# Patient Record
Sex: Female | Born: 1995
Health system: Southern US, Community
[De-identification: ages and names within clinical notes are randomized; demographics above are authoritative.]

---
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
## 2013-01-25 ENCOUNTER — Emergency Department (HOSPITAL_COMMUNITY)
Admission: EM | Admit: 2013-01-25 | Discharge: 2013-01-25 | Disposition: A | Discharge status: Home / Self Care | Payer: No Typology Code available for payment source | Attending: Emergency Medicine | Admitting: Emergency Medicine

## 2013-01-25 ENCOUNTER — Encounter (HOSPITAL_COMMUNITY): Payer: Self-pay | Admitting: Emergency Medicine

## 2013-01-25 DIAGNOSIS — S0990XA Unspecified injury of head, initial encounter: Secondary | ICD-10-CM | POA: Insufficient documentation

## 2013-01-25 DIAGNOSIS — Z79899 Other long term (current) drug therapy: Secondary | ICD-10-CM | POA: Insufficient documentation

## 2013-01-25 DIAGNOSIS — Z792 Long term (current) use of antibiotics: Secondary | ICD-10-CM | POA: Insufficient documentation

## 2013-01-25 DIAGNOSIS — Y9389 Activity, other specified: Secondary | ICD-10-CM | POA: Insufficient documentation

## 2013-01-25 DIAGNOSIS — Y92410 Unspecified street and highway as the place of occurrence of the external cause: Secondary | ICD-10-CM | POA: Insufficient documentation

## 2013-01-25 DIAGNOSIS — IMO0002 Reserved for concepts with insufficient information to code with codable children: Secondary | ICD-10-CM | POA: Insufficient documentation

## 2013-01-25 MED ORDER — METHOCARBAMOL 500 MG PO TABS: 500.0000 mg | ORAL_TABLET | Freq: Two times a day (BID) | ORAL | Status: DC

## 2013-01-25 MED ORDER — IBUPROFEN 800 MG PO TABS: 800.0000 mg | ORAL_TABLET | Freq: Three times a day (TID) | ORAL | Status: AC
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
## 2013-01-25 NOTE — ED Provider Notes (Signed)
Medical screening examination/treatment/procedure(s) were performed by non-physician practitioner and as supervising physician I was immediately available for consultation/collaboration.  EKG Interpretation   None        Jaidence Geisler, MD 01/25/13 1929 
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
## 2020-08-25 ENCOUNTER — Ambulatory Visit (HOSPITAL_COMMUNITY): Admission: EM | Admit: 2020-08-25 | Discharge status: Against Medical Advice | Payer: Self-pay

## 2020-08-25 ENCOUNTER — Encounter (HOSPITAL_COMMUNITY): Payer: Self-pay

## 2020-08-25 ENCOUNTER — Ambulatory Visit (HOSPITAL_COMMUNITY)
Admission: EM | Admit: 2020-08-25 | Discharge: 2020-08-25 | Disposition: A | Discharge status: Home / Self Care | Payer: 59

## 2020-08-25 ENCOUNTER — Other Ambulatory Visit: Payer: Self-pay

## 2020-08-25 DIAGNOSIS — H6122 Impacted cerumen, left ear: Secondary | ICD-10-CM | POA: Diagnosis not present

## 2020-08-25 DIAGNOSIS — H66002 Acute suppurative otitis media without spontaneous rupture of ear drum, left ear: Secondary | ICD-10-CM

## 2020-08-25 MED ORDER — AMOXICILLIN-POT CLAVULANATE 875-125 MG PO TABS: 1.0000 | ORAL_TABLET | Freq: Two times a day (BID) | ORAL | 0 refills | Status: DC

## 2020-08-25 NOTE — Discharge Instructions (Addendum)
-  Start the antibiotic-Augmentin (amoxicillin-clavulanate), 1 pill every 12 hours for 7 days.  You can take this with food like with breakfast and dinner. -Try Debrox over the counter for maintenance of your earwax

## 2020-08-25 NOTE — ED Triage Notes (Signed)
T presents with left ear pain since last night. Denies fever, chills, drainage.

## 2020-08-25 NOTE — ED Provider Notes (Signed)
[facility]    CSN: [id number] Arrival date & time: 08/25/20  1118      History   Chief Complaint Chief Complaint  Patient presents with   Otalgia    HPI Stacie Parsons is a 25 y.o. female presenting with left ear pain for 1 day.  Self-reported medical history of cerumen impaction in the left ear in the past.  Describes 1 day of sharp and throbbing pain in her left ear.  Denies tinnitus, dizziness, hearing changes, muffled hearing.  Denies recent URI, fever/chills.  HPI  History reviewed. No pertinent past medical history.  There are no problems to display for this patient.   History reviewed. No pertinent surgical history.  OB History   No obstetric history on file.      Home Medications    Prior to Admission medications   Medication Sig Start Date End Date Taking? Authorizing Provider  amoxicillin-clavulanate (AUGMENTIN) 875-125 MG tablet Take 1 tablet by mouth every 12 (twelve) hours. 08/25/20  Yes Rhys Martini, PA-C  doxycycline (VIBRA-TABS) 100 MG tablet Take 1 tablet by mouth 2 (two) times daily. 12/10/12   [provider]  ibuprofen (ADVIL,MOTRIN) 800 MG tablet Take 1 tablet (800 mg total) by mouth 3 (three) times daily. 01/25/13   Fayrene Helper, PA-C  KURVELO 0.15-30 MG-MCG tablet Take 1 tablet by mouth daily. 08/11/20   [provider]  methocarbamol (ROBAXIN) 500 MG tablet Take 1 tablet (500 mg total) by mouth 2 (two) times daily. 01/25/13   Fayrene Helper, PA-C  tretinoin (RETIN-A) 0.1 % cream Apply 1 application topically at bedtime. 12/10/12   [provider]    Family History Family History  Problem Relation Age of Onset   Healthy Mother    Healthy Father     Social History Social History   Tobacco Use   Smoking status: Never   Smokeless tobacco: Never  Substance Use Topics   Alcohol use: No   Drug use: No     Allergies   Patient has no known allergies.   Review of Systems Review of Systems   Constitutional:  Negative for appetite change, chills and fever.  HENT:  Positive for ear pain. Negative for congestion, rhinorrhea, sinus pressure, sinus pain and sore throat.   Eyes:  Negative for redness and visual disturbance.  Respiratory:  Negative for cough, chest tightness, shortness of breath and wheezing.   Cardiovascular:  Negative for chest pain and palpitations.  Gastrointestinal:  Negative for abdominal pain, constipation, diarrhea, nausea and vomiting.  Genitourinary:  Negative for dysuria, frequency and urgency.  Musculoskeletal:  Negative for myalgias.  Neurological:  Negative for dizziness, weakness and headaches.  Psychiatric/Behavioral:  Negative for confusion.   All other systems reviewed and are negative.   Physical Exam Triage Vital Signs ED Triage Vitals  Enc Vitals Group     BP 08/25/20 1232 132/87     Pulse Rate 08/25/20 1232 100     Resp 08/25/20 1232 16     Temp 08/25/20 1232 98.3 F (36.8 C)     Temp Source 08/25/20 1232 Oral     SpO2 08/25/20 1232 100 %     Weight --      Height --      Head Circumference --      Peak Flow --      Pain Score 08/25/20 1229 8     Pain Loc --      Pain Edu? --  Excl. in [place]? --    No data found.  Updated Vital Signs BP 132/87 (BP Location: Right Arm)   Pulse 100   Temp 98.3 F (36.8 C) (Oral)   Resp 16   LMP 07/28/2020 (Exact Date)   SpO2 100%   Visual Acuity Right Eye Distance:   Left Eye Distance:   Bilateral Distance:    Right Eye Near:   Left Eye Near:    Bilateral Near:     Physical Exam Vitals reviewed.  Constitutional:      General: She is not in acute distress.    Appearance: Normal appearance. She is not ill-appearing.  HENT:     Head: Normocephalic and atraumatic.     Right Ear: Hearing, tympanic membrane, ear canal and external ear normal. No swelling or tenderness. There is no impacted cerumen. No mastoid tenderness. Tympanic membrane is not perforated, erythematous, retracted or  bulging.     Left Ear: Hearing, ear canal and external ear normal. Tenderness present. No swelling. There is no impacted cerumen. No mastoid tenderness. Tympanic membrane is erythematous. Tympanic membrane is not perforated, retracted or bulging.     Ears:     Comments: L TM initially fully occluded by cerumen L preauricular lymphadenopathy    Nose:     Right Sinus: No maxillary sinus tenderness or frontal sinus tenderness.     Left Sinus: No maxillary sinus tenderness or frontal sinus tenderness.     Mouth/Throat:     Mouth: Mucous membranes are moist.     Pharynx: Uvula midline. No oropharyngeal exudate or posterior oropharyngeal erythema.     Tonsils: No tonsillar exudate.  Cardiovascular:     Rate and Rhythm: Normal rate and regular rhythm.     Heart sounds: Normal heart sounds.  Pulmonary:     Breath sounds: Normal breath sounds and air entry. No wheezing, rhonchi or rales.  Chest:     Chest wall: No tenderness.  Abdominal:     General: Abdomen is flat. Bowel sounds are normal.     Tenderness: There is no abdominal tenderness. There is no guarding or rebound.  Lymphadenopathy:     Cervical: No cervical adenopathy.  Neurological:     General: No focal deficit present.     Mental Status: She is alert and oriented to person, place, and time.  Psychiatric:        Attention and Perception: Attention and perception normal.        Mood and Affect: Mood and affect normal.        Behavior: Behavior normal. Behavior is cooperative.        Thought Content: Thought content normal.        Judgment: Judgment normal.     UC Treatments / Results  Labs (all labs ordered are listed, but only abnormal results are displayed) Labs Reviewed - No data to display  EKG   Radiology No results found.  Procedures Procedures (including critical care time)  Medications Ordered in UC Medications - No data to display  Initial Impression / Assessment and Plan / UC Course  I have reviewed  the triage vital signs and the nursing notes.  Pertinent labs & imaging results that were available during my care of the patient were reviewed by me and considered in my medical decision making (see chart for details).     This patient is a 25 year old female presenting with L cerumen impaction and L AOM. Today this pt is afebrile nontachycardic nontachypneic, oxygenating well  on room air, no wheezes rhonchi or rales. Denies recent URI.  States she is not pregnant or breast-feeding. Augmentin sent as below.   ED return precautions discussed.  Final Clinical Impressions(s) / UC Diagnoses   Final diagnoses:  Impacted cerumen of left ear  Non-recurrent acute suppurative otitis media of left ear without spontaneous rupture of tympanic membrane     Discharge Instructions      -Start the antibiotic-Augmentin (amoxicillin-clavulanate), 1 pill every 12 hours for 7 days.  You can take this with food like with breakfast and dinner. -Try Debrox over the counter for maintenance of your earwax     ED Prescriptions     Medication Sig Dispense Auth. Provider   amoxicillin-clavulanate (AUGMENTIN) 875-125 MG tablet Take 1 tablet by mouth every 12 (twelve) hours. 14 tablet Rhys Martini, PA-C      PDMP not reviewed this encounter.   Rhys Martini, PA-C 08/25/20 1319

## 2021-02-03 ENCOUNTER — Encounter (HOSPITAL_COMMUNITY): Payer: Self-pay | Admitting: Emergency Medicine

## 2021-02-03 ENCOUNTER — Other Ambulatory Visit: Payer: Self-pay

## 2021-02-03 ENCOUNTER — Ambulatory Visit (HOSPITAL_COMMUNITY)
Admission: EM | Admit: 2021-02-03 | Discharge: 2021-02-03 | Disposition: A | Discharge status: Home / Self Care | Payer: BC Managed Care – PPO | Attending: Physician Assistant | Admitting: Physician Assistant

## 2021-02-03 DIAGNOSIS — H02844 Edema of left upper eyelid: Secondary | ICD-10-CM

## 2021-02-03 DIAGNOSIS — H0289 Other specified disorders of eyelid: Secondary | ICD-10-CM

## 2021-02-03 DIAGNOSIS — H02849 Edema of unspecified eye, unspecified eyelid: Secondary | ICD-10-CM

## 2021-02-03 DIAGNOSIS — H02841 Edema of right upper eyelid: Secondary | ICD-10-CM | POA: Diagnosis not present

## 2021-02-03 MED ORDER — METHYLPREDNISOLONE SODIUM SUCC 125 MG IJ SOLR
INTRAMUSCULAR | Status: AC
  Filled 2021-02-03: qty 2

## 2021-02-03 MED ORDER — CIPROFLOXACIN HCL 0.3 % OP SOLN: OPHTHALMIC | 0 refills | Status: AC

## 2021-02-03 MED ORDER — METHYLPREDNISOLONE ACETATE 80 MG/ML IJ SUSP
INTRAMUSCULAR | Status: AC
  Filled 2021-02-03: qty 1

## 2021-02-03 MED ORDER — METHYLPREDNISOLONE ACETATE 80 MG/ML IJ SUSP
60.0000 mg | Freq: Once | INTRAMUSCULAR | Status: AC
  Administered 2021-02-03: 60 mg via INTRAMUSCULAR

## 2021-02-03 NOTE — Discharge Instructions (Addendum)
I am concerned for an allergic reaction.  We gave you a shot of steroids to help with your symptoms.  You can use over-the-counter antihistamines including Zyrtec in the morning and famotidine/Pepcid at night.  Given you wear contacts and had some drainage we are going to cover for a bacterial infection.  Please take ciprofloxacin eyedrops as prescribed.  Do not put contacts in until you have been reevaluated and symptoms have resolved.  If symptoms do not improve quickly please follow-up with ophthalmologist as we discussed.  If you have any sudden worsening symptoms please return for reevaluation.

## 2021-02-03 NOTE — ED Triage Notes (Signed)
Pt presents with red, watery, and swelling in both eyes that started yesterday.

## 2021-02-03 NOTE — ED Provider Notes (Signed)
[facility]    CSN: [id number] Arrival date & time: 02/03/21  3154      History   Chief Complaint Chief Complaint  Patient presents with   Eye Swelling    HPI Stacie Parsons is a 25 y.o. female.   Patient presents today with a 2-day history of swelling and redness of both eyes but worse on left.  Reports symptoms began after she tried new contacts.  She initially had a burning sensation in both eyes presenting to contacts out.  She tried to use Systane but this did not provide any relief.  She has not used any additional contacts since that time.  She did take some allergy medication assuming this could be contributing to symptoms but this provided no relief.  She denies any visual changes but has noticed some drainage particularly overnight.  Denies any recent illness or additional symptoms including fever, nausea, vomiting, headache, cough, congestion.  She is able to perform daily activities despite symptoms.   History reviewed. No pertinent past medical history.  There are no problems to display for this patient.   History reviewed. No pertinent surgical history.  OB History   No obstetric history on file.      Home Medications    Prior to Admission medications   Medication Sig Start Date End Date Taking? Authorizing Provider  ciprofloxacin (CILOXAN) 0.3 % ophthalmic solution Administer 1 drop, every 2 hours, while awake, for 2 days. Then 1 drop, every 4 hours, while awake, for the next 5 days. 02/03/21  Yes Johnavon Mcclafferty K, PA-C  amoxicillin-clavulanate (AUGMENTIN) 875-125 MG tablet Take 1 tablet by mouth every 12 (twelve) hours. 08/25/20   Rhys Martini, PA-C  doxycycline (VIBRA-TABS) 100 MG tablet Take 1 tablet by mouth 2 (two) times daily. 12/10/12   [provider]  ibuprofen (ADVIL,MOTRIN) 800 MG tablet Take 1 tablet (800 mg total) by mouth 3 (three) times daily. 01/25/13   Fayrene Helper, PA-C  KURVELO 0.15-30 MG-MCG tablet Take 1 tablet by  mouth daily. 08/11/20   [provider]  methocarbamol (ROBAXIN) 500 MG tablet Take 1 tablet (500 mg total) by mouth 2 (two) times daily. 01/25/13   Fayrene Helper, PA-C  tretinoin (RETIN-A) 0.1 % cream Apply 1 application topically at bedtime. 12/10/12   [provider]    Family History Family History  Problem Relation Age of Onset   Healthy Mother    Healthy Father     Social History Social History   Tobacco Use   Smoking status: Never   Smokeless tobacco: Never  Substance Use Topics   Alcohol use: No   Drug use: No     Allergies   Patient has no known allergies.   Review of Systems Review of Systems  Constitutional:  Positive for activity change. Negative for appetite change, fatigue and fever.  HENT:  Negative for congestion, sinus pressure, sneezing and sore throat.   Eyes:  Positive for discharge and itching. Negative for photophobia, pain, redness and visual disturbance.  Respiratory:  Negative for cough and shortness of breath.   Cardiovascular:  Negative for chest pain.  Gastrointestinal:  Negative for abdominal pain, diarrhea, nausea and vomiting.  Neurological:  Negative for dizziness, light-headedness and headaches.    Physical Exam Triage Vital Signs ED Triage Vitals  Enc Vitals Group     BP 02/03/21 0909 134/87     Pulse Rate 02/03/21 0909 99     Resp 02/03/21 0909 16  Temp 02/03/21 0909 99.1 F (37.3 C)     Temp Source 02/03/21 0909 Oral     SpO2 02/03/21 0909 97 %     Weight --      Height --      Head Circumference --      Peak Flow --      Pain Score 02/03/21 0907 3     Pain Loc --      Pain Edu? --      Excl. in [place]? --    No data found.  Updated Vital Signs BP 134/87 (BP Location: Right Arm)   Pulse 99   Temp 99.1 F (37.3 C) (Oral)   Resp 16   LMP 01/17/2021   SpO2 97%   Visual Acuity Right Eye Distance: 20/20 (with glasses) Left Eye Distance: 20/25 (with glasses) Bilateral Distance: 20/20 (with  glasses)  Right Eye Near:   Left Eye Near:    Bilateral Near:     Physical Exam Vitals reviewed.  Constitutional:      General: She is awake. She is not in acute distress.    Appearance: Normal appearance. She is well-developed. She is not ill-appearing.     Comments: Very pleasant female appears stated age no acute distress sitting comfortably in exam room  HENT:     Head: Normocephalic and atraumatic.  Eyes:     General:        Right eye: No hordeolum.        Left eye: No hordeolum.     Extraocular Movements: Extraocular movements intact.     Conjunctiva/sclera: Conjunctivae normal.     Pupils: Pupils are equal, round, and reactive to light.     Comments: Swelling noted bilateral upper eyelids.  No significant injection or erythema of conjunctive.  Cardiovascular:     Rate and Rhythm: Normal rate and regular rhythm.     Heart sounds: Normal heart sounds, S1 normal and S2 normal. No murmur heard. Pulmonary:     Effort: Pulmonary effort is normal.     Breath sounds: Normal breath sounds. No wheezing, rhonchi or rales.     Comments: Clear to auscultation bilaterally Abdominal:     Palpations: Abdomen is soft.     Tenderness: There is no abdominal tenderness.  Musculoskeletal:     Cervical back: Normal range of motion and neck supple.  Psychiatric:        Behavior: Behavior is cooperative.     UC Treatments / Results  Labs (all labs ordered are listed, but only abnormal results are displayed) Labs Reviewed - No data to display  EKG   Radiology No results found.  Procedures Procedures (including critical care time)  Medications Ordered in UC Medications  methylPREDNISolone acetate (DEPO-MEDROL) injection 60 mg (60 mg Intramuscular Given 02/03/21 0949)    Initial Impression / Assessment and Plan / UC Course  I have reviewed the triage vital signs and the nursing notes.  Pertinent labs & imaging results that were available during my care of the patient were  reviewed by me and considered in my medical decision making (see chart for details).     Concern for allergic reaction given clinical presentation.  Patient was given Depo-Medrol in clinic with instruction to alternate antihistamines at home.  She did have some increased drainage from the eye and wears contacts and will cover with fluoroquinolone.  She was started on ciprofloxacin ophthalmic solution.  Discussed that she is not to use contacts until symptoms have resolved and  she is feeling much better.  Can continue using Systane/lubricating eyedrops as needed.  Recommended warm compresses for additional symptom relief.  Discussed that if she has any worsening symptoms including increased swelling or vision changes she needs to be seen by ophthalmologist and was given contact information for local provider.  Discussed alarm symptoms that warrant going to the ER.  Strict return precautions given to which she expressed understanding.  Final Clinical Impressions(s) / UC Diagnoses   Final diagnoses:  Pain and swelling of eyelid     Discharge Instructions      I am concerned for an allergic reaction.  We gave you a shot of steroids to help with your symptoms.  You can use over-the-counter antihistamines including Zyrtec in the morning and famotidine/Pepcid at night.  Given you wear contacts and had some drainage we are going to cover for a bacterial infection.  Please take ciprofloxacin eyedrops as prescribed.  Do not put contacts in until you have been reevaluated and symptoms have resolved.  If symptoms do not improve quickly please follow-up with ophthalmologist as we discussed.  If you have any sudden worsening symptoms please return for reevaluation.     ED Prescriptions     Medication Sig Dispense Auth. Provider   ciprofloxacin (CILOXAN) 0.3 % ophthalmic solution Administer 1 drop, every 2 hours, while awake, for 2 days. Then 1 drop, every 4 hours, while awake, for the next 5 days. 5 mL  Hartlyn Reigel K, PA-C      PDMP not reviewed this encounter.   Terrilee Croak, PA-C 02/03/21 1001

## 2021-03-18 ENCOUNTER — Encounter (HOSPITAL_COMMUNITY): Payer: Self-pay

## 2021-03-18 ENCOUNTER — Ambulatory Visit (HOSPITAL_COMMUNITY)
Admission: EM | Admit: 2021-03-18 | Discharge: 2021-03-18 | Disposition: A | Discharge status: Home / Self Care | Payer: BC Managed Care – PPO | Attending: Urgent Care | Admitting: Urgent Care

## 2021-03-18 ENCOUNTER — Other Ambulatory Visit: Payer: Self-pay

## 2021-03-18 DIAGNOSIS — R000 Tachycardia, unspecified: Secondary | ICD-10-CM | POA: Diagnosis not present

## 2021-03-18 DIAGNOSIS — U071 COVID-19: Secondary | ICD-10-CM | POA: Diagnosis not present

## 2021-03-18 MED ORDER — CETIRIZINE HCL 10 MG PO TABS: 10.0000 mg | ORAL_TABLET | Freq: Every day | ORAL | 0 refills | Status: AC

## 2021-03-18 MED ORDER — MOLNUPIRAVIR EUA 200MG CAPSULE: 4.0000 | ORAL_CAPSULE | Freq: Two times a day (BID) | ORAL | 0 refills | Status: AC

## 2021-03-18 MED ORDER — BENZONATATE 100 MG PO CAPS
100.0000 mg | ORAL_CAPSULE | Freq: Three times a day (TID) | ORAL | 0 refills | Status: DC | PRN
Start: 2021-03-18 — End: 2023-04-21

## 2021-03-18 MED ORDER — PROMETHAZINE-DM 6.25-15 MG/5ML PO SYRP: 5.0000 mL | ORAL_SOLUTION | Freq: Every evening | ORAL | 0 refills | Status: DC | PRN

## 2021-03-18 NOTE — ED Provider Notes (Signed)
[facility]   MRN: [id number] DOB: 1995-11-27  Subjective:   Stacie Parsons is a 25 y.o. female presenting for COVID evaluation.  Took a COVID test and was negative on 03/09/2021. Initially had a stuffy nose, throat pain, sneezing. Was improving and feeling better up until 03/16/2021. Started coughing, having back soreness. She is now having throat pain, stuffy nose again. Took a COVID test today and was positive.  Patient is not a smoker, no history of respiratory disorders.  She is in general good health.  Only takes OCPs.  Does not want to take Paxlovid because she does not want blood work. Would like molnupiravir.   No current facility-administered medications for this encounter.  Current Outpatient Medications:    amoxicillin-clavulanate (AUGMENTIN) 875-125 MG tablet, Take 1 tablet by mouth every 12 (twelve) hours., Disp: 14 tablet, Rfl: 0   ciprofloxacin (CILOXAN) 0.3 % ophthalmic solution, Administer 1 drop, every 2 hours, while awake, for 2 days. Then 1 drop, every 4 hours, while awake, for the next 5 days., Disp: 5 mL, Rfl: 0   ibuprofen (ADVIL,MOTRIN) 800 MG tablet, Take 1 tablet (800 mg total) by mouth 3 (three) times daily., Disp: 21 tablet, Rfl: 0   KURVELO 0.15-30 MG-MCG tablet, Take 1 tablet by mouth daily., Disp: , Rfl:    No Known Allergies  History reviewed. No pertinent past medical history.   History reviewed. No pertinent surgical history.  Family History  Problem Relation Age of Onset   Healthy Mother    Healthy Father    Social History   Tobacco Use   Smoking status: Never   Smokeless tobacco: Never  Substance Use Topics   Alcohol use: No   Drug use: No    ROS   Objective:   Vitals: BP (!) 148/104    Pulse (!) 122    Temp 99.7 F (37.6 C) (Oral)    Resp 17    LMP 03/11/2021 (Exact Date)    SpO2 97%   Physical Exam Constitutional:      General: She is not in acute distress.    Appearance: Normal appearance. She is  well-developed. She is not ill-appearing, toxic-appearing or diaphoretic.  HENT:     Head: Normocephalic and atraumatic.     Nose: Nose normal.     Mouth/Throat:     Mouth: Mucous membranes are moist.  Eyes:     Extraocular Movements: Extraocular movements intact.     Pupils: Pupils are equal, round, and reactive to light.  Cardiovascular:     Rate and Rhythm: Normal rate and regular rhythm.     Pulses: Normal pulses.     Heart sounds: Normal heart sounds. No murmur heard.   No friction rub. No gallop.  Pulmonary:     Effort: Pulmonary effort is normal. No respiratory distress.     Breath sounds: Normal breath sounds. No stridor. No wheezing, rhonchi or rales.  Skin:    General: Skin is warm and dry.     Findings: No rash.  Neurological:     Mental Status: She is alert and oriented to person, place, and time.  Psychiatric:        Mood and Affect: Mood normal.        Behavior: Behavior normal.        Thought Content: Thought content normal.   ED ECG REPORT   Date: 03/18/2021  Rate: 120bpm  Rhythm: sinus tachycardia  QRS Axis: normal  Intervals: normal  ST/T  Wave abnormalities: nonspecific T wave changes  Conduction Disutrbances:none  Narrative Interpretation: Sinus rhythm at 120 bpm but nonspecific T wave flattening in lead III, aVF and V6.  No previous EKG available for comparison.  Old EKG Reviewed: none available  I have personally reviewed the EKG tracing and agree with the computerized printout as noted.  Assessment and Plan :   PDMP not reviewed this encounter.  1. Clinical diagnosis of COVID-19   2. Tachycardia    Suspect patient has sinus tachycardia related to her COVID-19.  I discussed with patient that she likely started out with a cold and then progressed to have COVID-19 as well.  I will honor patient's request to have COVID antivirals given her vital signs, sinus tachycardia.  We will use molnupiravir. Use supportive care otherwise.  Patient has a clear  cardiopulmonary exam and therefore will defer imaging. Counseled patient on potential for adverse effects with medications prescribed/recommended today, ER and return-to-clinic precautions discussed, patient verbalized understanding.    Wallis Bamberg, PA-C 03/18/21 1747

## 2021-03-18 NOTE — ED Triage Notes (Signed)
Pt presents with sore throat, cough, and nasal congestion x 1 week.

## 2021-03-19 LAB — SARS CORONAVIRUS 2 (TAT 6-24 HRS): SARS Coronavirus 2: POSITIVE — AB

## 2023-04-21 ENCOUNTER — Ambulatory Visit
Admission: EM | Admit: 2023-04-21 | Discharge: 2023-04-21 | Disposition: A | Discharge status: Home / Self Care | Payer: 59

## 2023-04-21 ENCOUNTER — Encounter: Payer: Self-pay | Admitting: Emergency Medicine

## 2023-04-21 DIAGNOSIS — U071 COVID-19: Secondary | ICD-10-CM | POA: Diagnosis not present

## 2023-04-21 MED ORDER — BENZONATATE 100 MG PO CAPS: 100.0000 mg | ORAL_CAPSULE | Freq: Three times a day (TID) | ORAL | 0 refills | Status: AC

## 2023-04-21 MED ORDER — MOLNUPIRAVIR EUA 200MG CAPSULE: 4.0000 | ORAL_CAPSULE | Freq: Two times a day (BID) | ORAL | 0 refills | Status: AC

## 2023-04-21 MED ORDER — PROMETHAZINE-DM 6.25-15 MG/5ML PO SYRP: 5.0000 mL | ORAL_SOLUTION | Freq: Every evening | ORAL | 0 refills | Status: DC | PRN

## 2023-04-21 NOTE — ED Triage Notes (Signed)
Pt cough, sore throat, and congestion since Saturday. She had positive COVID test at home.  She has been taking Nyquil and benadryl. Last dose was 4am

## 2023-04-21 NOTE — ED Provider Notes (Signed)
[facility]    CSN: [id number] Arrival date & time: 04/21/23  1351      History   Chief Complaint Chief Complaint  Patient presents with   Cough    HPI Stacie Parsons is a 28 y.o. female.   Stacie Parsons is a 28 y.o. female presenting for chief complaint of Cough, sore throat, headache, and congestion that started yesterday. At home COVID test was positive today. Cough is mostly dry and minimally productive with clear sputum. She is unsure of max temp at home. No history of chronic respiratory problems, never smoker, denies drug use. Denies N/V/D, abdominal pain, rash, dizziness, shortness of breath, chest pain, and palpitations. Taking OTC medicines with some relief. Requests molnupiravir instead of paxlovid.    Cough   History reviewed. No pertinent past medical history.  There are no active problems to display for this patient.   History reviewed. No pertinent surgical history.  OB History   No obstetric history on file.      Home Medications    Prior to Admission medications   Medication Sig Start Date End Date Taking? Authorizing Provider  benzonatate (TESSALON) 100 MG capsule Take 1 capsule (100 mg total) by mouth every 8 (eight) hours. 04/21/23  Yes Carlisle Beers, FNP  busPIRone (BUSPAR) 5 MG tablet Take 5 mg by mouth 2 (two) times daily. 10/11/22  Yes [provider]  molnupiravir EUA (LAGEVRIO) 200 mg CAPS capsule Take 4 capsules (800 mg total) by mouth 2 (two) times daily for 5 days. 04/21/23 04/26/23 Yes Carlisle Beers, FNP  promethazine-dextromethorphan (PROMETHAZINE-DM) 6.25-15 MG/5ML syrup Take 5 mLs by mouth at bedtime as needed for cough. 04/21/23  Yes Carlisle Beers, FNP  amoxicillin-clavulanate (AUGMENTIN) 875-125 MG tablet Take 1 tablet by mouth every 12 (twelve) hours. Patient not taking: Reported on 04/21/2023 08/25/20   Rhys Martini, PA-C  cetirizine (ZYRTEC ALLERGY) 10 MG tablet Take 1 tablet (10 mg total)  by mouth daily. 03/18/21   Wallis Bamberg, PA-C  ciprofloxacin (CILOXAN) 0.3 % ophthalmic solution Administer 1 drop, every 2 hours, while awake, for 2 days. Then 1 drop, every 4 hours, while awake, for the next 5 days. Patient not taking: Reported on 04/21/2023 02/03/21   Raspet, Noberto Retort, PA-C  ibuprofen (ADVIL,MOTRIN) 800 MG tablet Take 1 tablet (800 mg total) by mouth 3 (three) times daily. 01/25/13   Fayrene Helper, PA-C  KURVELO 0.15-30 MG-MCG tablet Take 1 tablet by mouth daily. 08/11/20   [provider]    Family History Family History  Problem Relation Age of Onset   Healthy Mother    Healthy Father     Social History Social History   Tobacco Use   Smoking status: Never   Smokeless tobacco: Never  Substance Use Topics   Alcohol use: No   Drug use: No     Allergies   Patient has no known allergies.   Review of Systems Review of Systems  Respiratory:  Positive for cough.   Per HPI   Physical Exam Triage Vital Signs ED Triage Vitals  Encounter Vitals Group     BP 04/21/23 1545 (!) 151/91     Systolic BP Percentile --      Diastolic BP Percentile --      Pulse Rate 04/21/23 1545 (!) 112     Resp 04/21/23 1545 17     Temp 04/21/23 1545 99.9 F (37.7 C)     Temp Source 04/21/23 1545 Oral  SpO2 04/21/23 1545 95 %     Weight --      Height --      Head Circumference --      Peak Flow --      Pain Score 04/21/23 1547 6     Pain Loc --      Pain Education --      Exclude from Growth Chart --    No data found.  Updated Vital Signs BP (!) 151/91 (BP Location: Right Arm)   Pulse (!) 112   Temp 99.9 F (37.7 C) (Oral)   Resp 17   LMP 04/20/2023 (Exact Date)   SpO2 95%   Visual Acuity Right Eye Distance:   Left Eye Distance:   Bilateral Distance:    Right Eye Near:   Left Eye Near:    Bilateral Near:     Physical Exam Vitals and nursing note reviewed.  Constitutional:      Appearance: She is not ill-appearing or toxic-appearing.  HENT:      Head: Normocephalic and atraumatic.     Right Ear: Hearing, tympanic membrane, ear canal and external ear normal.     Left Ear: Hearing, tympanic membrane, ear canal and external ear normal.     Nose: Congestion present.     Mouth/Throat:     Lips: Pink.     Mouth: Mucous membranes are moist. No injury or oral lesions.     Dentition: Normal dentition.     Tongue: No lesions.     Pharynx: Oropharynx is clear. Uvula midline. No pharyngeal swelling, oropharyngeal exudate, posterior oropharyngeal erythema, uvula swelling or postnasal drip.     Tonsils: No tonsillar exudate.  Eyes:     General: Lids are normal. Vision grossly intact. Gaze aligned appropriately.     Extraocular Movements: Extraocular movements intact.     Conjunctiva/sclera: Conjunctivae normal.  Neck:     Trachea: Trachea and phonation normal.  Cardiovascular:     Rate and Rhythm: Normal rate and regular rhythm.     Heart sounds: Normal heart sounds, S1 normal and S2 normal.  Pulmonary:     Effort: Pulmonary effort is normal. No respiratory distress.     Breath sounds: Normal breath sounds and air entry. No wheezing, rhonchi or rales.  Chest:     Chest wall: No tenderness.  Musculoskeletal:     Cervical back: Neck supple.  Lymphadenopathy:     Cervical: No cervical adenopathy.  Skin:    General: Skin is warm and dry.     Capillary Refill: Capillary refill takes less than 2 seconds.     Findings: No rash.  Neurological:     General: No focal deficit present.     Mental Status: She is alert and oriented to person, place, and time. Mental status is at baseline.     Cranial Nerves: No dysarthria or facial asymmetry.  Psychiatric:        Mood and Affect: Mood normal.        Speech: Speech normal.        Behavior: Behavior normal.        Thought Content: Thought content normal.        Judgment: Judgment normal.      UC Treatments / Results  Labs (all labs ordered are listed, but only abnormal results are  displayed) Labs Reviewed - No data to display  EKG   Radiology No results found.  Procedures Procedures (including critical care time)  Medications Ordered in UC  Medications - No data to display  Initial Impression / Assessment and Plan / UC Course  I have reviewed the triage vital signs and the nursing notes.  Pertinent labs & imaging results that were available during my care of the patient were reviewed by me and considered in my medical decision making (see chart for details).   1. COVID-19 COVID testing positive at home.  Lungs clear, vitals hemodynamically stable, therefore deferred imaging of the chest.  Patient is a candidate for antiviral (Molnupiravir) given risk factors for severe COVID-19 illness.  She would prefer Molnupiravir over Paxlovid since her close family member was diagnosed with breast cancer after taking Paxlovid. Discussed insurance may not cover molnupiravir, she is agreeable with still proceeding with molnupiravir instead of paxlovid.  Antiviral sent to pharmacy.  Discussed most up to date CDC guidelines regarding quarantine and masking to prevent transmission.  Recommend supportive care for symptomatic relief as outlined in AVS.    Counseled patient on potential for adverse effects with medications prescribed/recommended today, strict ER and return-to-clinic precautions discussed, patient verbalized understanding.    Final Clinical Impressions(s) / UC Diagnoses   Final diagnoses:  COVID-19     Discharge Instructions      You have COVID-19 viral illness. Wear a mask for 5 days of symptoms. You may return to public within those 5 days as long as you do not have a fever. Wash hands frequently.  - Take prescribed medicines to help with symptoms: tessalon perles, promethazine DM (drowsiness precautions with cough syrup, only take at bedtime) - Use over the counter medicines to help with symptoms as discussed: Tylenol, guaifenesin (mucinex),  zyrtec, etc - Two teaspoons of honey in warm water every 4-6 hours may help with throat pains - Humidifier in your room at night to help add water the air and soothe cough  If you develop any new or worsening symptoms or do not improve in the next 2 to 3 days, please return.  If your symptoms are severe, please go to the emergency room.  Follow-up with PCP as needed.      ED Prescriptions     Medication Sig Dispense Auth. Provider   molnupiravir EUA (LAGEVRIO) 200 mg CAPS capsule Take 4 capsules (800 mg total) by mouth 2 (two) times daily for 5 days. 40 capsule Carlisle Beers, [place]   promethazine-dextromethorphan (PROMETHAZINE-DM) 6.25-15 MG/5ML syrup Take 5 mLs by mouth at bedtime as needed for cough. 118 mL Reita May M, FNP   benzonatate (TESSALON) 100 MG capsule Take 1 capsule (100 mg total) by mouth every 8 (eight) hours. 21 capsule Carlisle Beers, FNP      PDMP not reviewed this encounter.   Carlisle Beers, [place] 04/23/23 2031

## 2023-04-21 NOTE — Discharge Instructions (Signed)
You have COVID-19 viral illness. Wear a mask for 5 days of symptoms. You may return to public within those 5 days as long as you do not have a fever. Wash hands frequently.  - Take prescribed medicines to help with symptoms: tessalon perles, promethazine DM (drowsiness precautions with cough syrup, only take at bedtime) - Use over the counter medicines to help with symptoms as discussed: Tylenol, guaifenesin (mucinex), zyrtec, etc - Two teaspoons of honey in warm water every 4-6 hours may help with throat pains - Humidifier in your room at night to help add water the air and soothe cough  If you develop any new or worsening symptoms or do not improve in the next 2 to 3 days, please return.  If your symptoms are severe, please go to the emergency room.  Follow-up with PCP as needed.

## 2023-06-19 ENCOUNTER — Ambulatory Visit: Payer: Self-pay

## 2023-06-19 ENCOUNTER — Ambulatory Visit
Admission: EM | Admit: 2023-06-19 | Discharge: 2023-06-19 | Disposition: A | Discharge status: Home / Self Care | Attending: Internal Medicine | Admitting: Internal Medicine

## 2023-06-19 ENCOUNTER — Encounter: Payer: Self-pay | Admitting: Emergency Medicine

## 2023-06-19 DIAGNOSIS — J329 Chronic sinusitis, unspecified: Secondary | ICD-10-CM

## 2023-06-19 DIAGNOSIS — J40 Bronchitis, not specified as acute or chronic: Secondary | ICD-10-CM | POA: Diagnosis not present

## 2023-06-19 DIAGNOSIS — R0602 Shortness of breath: Secondary | ICD-10-CM | POA: Diagnosis not present

## 2023-06-19 MED ORDER — PREDNISONE 20 MG PO TABS: 40.0000 mg | ORAL_TABLET | Freq: Every day | ORAL | 0 refills | Status: AC

## 2023-06-19 MED ORDER — PROMETHAZINE-DM 6.25-15 MG/5ML PO SYRP: 5.0000 mL | ORAL_SOLUTION | Freq: Every evening | ORAL | 0 refills | Status: AC | PRN

## 2023-06-19 MED ORDER — ALBUTEROL SULFATE HFA 108 (90 BASE) MCG/ACT IN AERS: 1.0000 | INHALATION_SPRAY | Freq: Four times a day (QID) | RESPIRATORY_TRACT | 0 refills | Status: AC | PRN

## 2023-06-19 MED ORDER — AMOXICILLIN-POT CLAVULANATE 875-125 MG PO TABS: 1.0000 | ORAL_TABLET | Freq: Two times a day (BID) | ORAL | 0 refills | Status: AC

## 2023-06-19 NOTE — Discharge Instructions (Addendum)
 Your evaluation shows you have a bacterial sinus infection plus a viral infection of the upper airways of your lungs. Use the following medicines to help with your symptoms:  - Take antibiotic sent to pharmacy as directed to treat sinus infection. - Take steroid sent to pharmacy as directed starting tomorrow. Do not take any other NSAID containing medications such as ibuprofen or naproxen/Aleve while taking prednisone. - You may use albuterol inhaler 1 to 2 puffs every 4-6 hours as needed for cough, shortness of breath, and wheezing. - Take Promethazine DM cough medication to help with your cough at nighttime so that you are able to sleep. Do not drive, drink alcohol, or go to work while taking this medication since it can make you sleepy. Only take this at nighttime.  - Purchase Mucinex over the counter and take this every 12 hours as needed for nasal congestion and cough.  If you develop any new or worsening symptoms or do not improve in the next 2 to 3 days, please return.  If your symptoms are severe, please go to the emergency room.  Follow-up with your primary care provider for further evaluation and management of your symptoms as well as ongoing wellness visits.  I hope you feel better!

## 2023-06-19 NOTE — ED Provider Notes (Signed)
 [facility]    CSN: [id number] Arrival date & time: 06/19/23  1751      History   Chief Complaint Chief Complaint  Patient presents with   Cough    HPI Stacie Parsons is a 28 y.o. female.   Stacie Parsons is a 28 y.o. female presenting for chief complaint of Cough that started June 05, 2023. Cough is productive with thick yellow/green phlegm. Reports intermittent shortness of breath and bilateral chest discomfort associated with coughing. Additionally reports nasal congestion, that is yellow/green too. No recent fevers, chills, nausea, vomiting, diarrhea, abdominal pain, rash, or dizziness. Never smoker, no history of asthma/copd. No recent antibiotic/steroid use in the last 90 days. Taking OTC medicines without much relief.    Cough   History reviewed. No pertinent past medical history.  There are no active problems to display for this patient.   History reviewed. No pertinent surgical history.  OB History   No obstetric history on file.      Home Medications    Prior to Admission medications   Medication Sig Start Date End Date Taking? Authorizing Provider  albuterol (VENTOLIN HFA) 108 (90 Base) MCG/ACT inhaler Inhale 1-2 puffs into the lungs every 6 (six) hours as needed for wheezing or shortness of breath. 06/19/23  Yes Carlisle Beers, FNP  amoxicillin-clavulanate (AUGMENTIN) 875-125 MG tablet Take 1 tablet by mouth every 12 (twelve) hours. 06/19/23  Yes Carlisle Beers, FNP  predniSONE (DELTASONE) 20 MG tablet Take 2 tablets (40 mg total) by mouth daily with breakfast for 5 days. 06/19/23 06/24/23 Yes Ruchi Stoney, Donavan Burnet, FNP  benzonatate (TESSALON) 100 MG capsule Take 1 capsule (100 mg total) by mouth every 8 (eight) hours. Patient not taking: Reported on 06/19/2023 04/21/23   Carlisle Beers, FNP  busPIRone (BUSPAR) 5 MG tablet Take 5 mg by mouth 2 (two) times daily. 10/11/22   [provider]  cetirizine (ZYRTEC ALLERGY) 10 MG  tablet Take 1 tablet (10 mg total) by mouth daily. 03/18/21   Wallis Bamberg, PA-C  ciprofloxacin (CILOXAN) 0.3 % ophthalmic solution Administer 1 drop, every 2 hours, while awake, for 2 days. Then 1 drop, every 4 hours, while awake, for the next 5 days. Patient not taking: Reported on 04/21/2023 02/03/21   Raspet, Noberto Retort, PA-C  ibuprofen (ADVIL,MOTRIN) 800 MG tablet Take 1 tablet (800 mg total) by mouth 3 (three) times daily. 01/25/13   Fayrene Helper, PA-C  KURVELO 0.15-30 MG-MCG tablet Take 1 tablet by mouth daily. 08/11/20   [provider]  promethazine-dextromethorphan (PROMETHAZINE-DM) 6.25-15 MG/5ML syrup Take 5 mLs by mouth at bedtime as needed for cough. 06/19/23   Carlisle Beers, FNP    Family History Family History  Problem Relation Age of Onset   Healthy Mother    Healthy Father     Social History Social History   Tobacco Use   Smoking status: Never   Smokeless tobacco: Never  Substance Use Topics   Alcohol use: No   Drug use: No     Allergies   Patient has no known allergies.   Review of Systems Review of Systems  Respiratory:  Positive for cough.   Per HPI   Physical Exam Triage Vital Signs ED Triage Vitals  Encounter Vitals Group     BP 06/19/23 1828 137/81     Systolic BP Percentile --      Diastolic BP Percentile --      Pulse Rate 06/19/23 1828 91  Resp 06/19/23 1828 18     Temp 06/19/23 1828 98.3 F (36.8 C)     Temp Source 06/19/23 1828 Oral     SpO2 06/19/23 1828 98 %     Weight --      Height --      Head Circumference --      Peak Flow --      Pain Score 06/19/23 1832 4     Pain Loc --      Pain Education --      Exclude from Growth Chart --    No data found.  Updated Vital Signs BP 137/81 (BP Location: Right Arm)   Pulse 91   Temp 98.3 F (36.8 C) (Oral)   Resp 18   LMP 06/17/2023 (Exact Date)   SpO2 98%   Visual Acuity Right Eye Distance:   Left Eye Distance:   Bilateral Distance:    Right Eye Near:   Left  Eye Near:    Bilateral Near:     Physical Exam Vitals and nursing note reviewed.  Constitutional:      Appearance: She is not ill-appearing or toxic-appearing.  HENT:     Head: Normocephalic and atraumatic.     Right Ear: Hearing, tympanic membrane, ear canal and external ear normal.     Left Ear: Hearing, tympanic membrane, ear canal and external ear normal.     Nose: Congestion present.     Right Sinus: Frontal sinus tenderness present.     Left Sinus: Frontal sinus tenderness present.     Mouth/Throat:     Lips: Pink.     Mouth: Mucous membranes are moist. No injury or oral lesions.     Dentition: Normal dentition.     Tongue: No lesions.     Pharynx: Oropharynx is clear. Uvula midline. No pharyngeal swelling, oropharyngeal exudate, posterior oropharyngeal erythema, uvula swelling or postnasal drip.     Tonsils: No tonsillar exudate.  Eyes:     General: Lids are normal. Vision grossly intact. Gaze aligned appropriately.     Extraocular Movements: Extraocular movements intact.     Conjunctiva/sclera: Conjunctivae normal.  Neck:     Trachea: Trachea and phonation normal.  Cardiovascular:     Rate and Rhythm: Normal rate and regular rhythm.     Heart sounds: Normal heart sounds, S1 normal and S2 normal.  Pulmonary:     Effort: Pulmonary effort is normal. No respiratory distress.     Breath sounds: Normal breath sounds and air entry. No wheezing, rhonchi or rales.     Comments: Course breath sounds throughout. Speaking in full sentences without difficulty.  Chest:     Chest wall: No tenderness.  Musculoskeletal:     Cervical back: Neck supple.  Lymphadenopathy:     Cervical: No cervical adenopathy.  Skin:    General: Skin is warm and dry.     Capillary Refill: Capillary refill takes less than 2 seconds.     Findings: No rash.  Neurological:     General: No focal deficit present.     Mental Status: She is alert and oriented to person, place, and time. Mental status is at  baseline.     Cranial Nerves: No dysarthria or facial asymmetry.  Psychiatric:        Mood and Affect: Mood normal.        Speech: Speech normal.        Behavior: Behavior normal.        Thought Content: Thought  content normal.        Judgment: Judgment normal.      UC Treatments / Results  Labs (all labs ordered are listed, but only abnormal results are displayed) Labs Reviewed - No data to display  EKG   Radiology No results found.  Procedures Procedures (including critical care time)  Medications Ordered in UC Medications - No data to display  Initial Impression / Assessment and Plan / UC Course  I have reviewed the triage vital signs and the nursing notes.  Pertinent labs & imaging results that were available during my care of the patient were reviewed by me and considered in my medical decision making (see chart for details).   1. Sinobronchitis, shortness of breath Presentation suspicious for sinobronchitis.  Deferred imaging based on stable cardiopulmonary exam, hemodynamically stable vital signs, and low suspicion for pneumonia/focal consolidation. Short course of antibiotics and steroid burst prescribed.  Albuterol as needed. Recommend further OTC/prescription medications for supportive care and symptomatic relief as outlined in AVS.   Counseled patient on potential for adverse effects with medications prescribed/recommended today, strict ER and return-to-clinic precautions discussed, patient verbalized understanding.    Final Clinical Impressions(s) / UC Diagnoses   Final diagnoses:  Sinobronchitis  Shortness of breath     Discharge Instructions      Your evaluation shows you have a bacterial sinus infection plus a viral infection of the upper airways of your lungs. Use the following medicines to help with your symptoms:  - Take antibiotic sent to pharmacy as directed to treat sinus infection. - Take steroid sent to pharmacy as directed starting  tomorrow. Do not take any other NSAID containing medications such as ibuprofen or naproxen/Aleve while taking prednisone. - You may use albuterol inhaler 1 to 2 puffs every 4-6 hours as needed for cough, shortness of breath, and wheezing. - Take Promethazine DM cough medication to help with your cough at nighttime so that you are able to sleep. Do not drive, drink alcohol, or go to work while taking this medication since it can make you sleepy. Only take this at nighttime.  - Purchase Mucinex over the counter and take this every 12 hours as needed for nasal congestion and cough.  If you develop any new or worsening symptoms or do not improve in the next 2 to 3 days, please return.  If your symptoms are severe, please go to the emergency room.  Follow-up with your primary care provider for further evaluation and management of your symptoms as well as ongoing wellness visits.  I hope you feel better!      ED Prescriptions     Medication Sig Dispense Auth. Provider   promethazine-dextromethorphan (PROMETHAZINE-DM) 6.25-15 MG/5ML syrup Take 5 mLs by mouth at bedtime as needed for cough. 118 mL Reita May M, FNP   predniSONE (DELTASONE) 20 MG tablet Take 2 tablets (40 mg total) by mouth daily with breakfast for 5 days. 10 tablet Carlisle Beers, FNP   amoxicillin-clavulanate (AUGMENTIN) 875-125 MG tablet Take 1 tablet by mouth every 12 (twelve) hours. 14 tablet Carlisle Beers, FNP   albuterol (VENTOLIN HFA) 108 (90 Base) MCG/ACT inhaler Inhale 1-2 puffs into the lungs every 6 (six) hours as needed for wheezing or shortness of breath. 8 g Carlisle Beers, FNP      PDMP not reviewed this encounter.   Carlisle Beers, [place] 06/19/23 1906

## 2023-06-19 NOTE — ED Triage Notes (Signed)
 Pt states March 19th she had scratchy throat, congestion, cough, and sob. This morning snot had reddish orange.  She has been taking benadryl, sudafed, and nightquil

## 2023-07-22 ENCOUNTER — Other Ambulatory Visit: Payer: Self-pay | Admitting: Obstetrics and Gynecology

## 2023-07-22 DIAGNOSIS — N63 Unspecified lump in unspecified breast: Secondary | ICD-10-CM

## 2023-08-22 ENCOUNTER — Ambulatory Visit
Admission: RE | Admit: 2023-08-22 | Discharge: 2023-08-22 | Disposition: A | Discharge status: Home / Self Care | Source: Ambulatory Visit | Attending: Obstetrics and Gynecology

## 2023-08-22 ENCOUNTER — Ambulatory Visit
Admission: RE | Admit: 2023-08-22 | Discharge: 2023-08-22 | Disposition: A | Discharge status: Home / Self Care | Source: Ambulatory Visit | Attending: Obstetrics and Gynecology | Admitting: Obstetrics and Gynecology

## 2023-08-22 DIAGNOSIS — N63 Unspecified lump in unspecified breast: Secondary | ICD-10-CM
